# Patient Record
Sex: Male | Born: 1995 | Race: White | Hispanic: No | Marital: Single | Smoking: Current some day smoker
Health system: Southern US, Community
[De-identification: ages and names within clinical notes are randomized; demographics above are authoritative.]

## PROBLEM LIST (undated history)

## (undated) DIAGNOSIS — F909 Attention-deficit hyperactivity disorder, unspecified type: Secondary | ICD-10-CM

## (undated) HISTORY — PX: TONSILLECTOMY: SUR1361

## (undated) HISTORY — DX: Attention-deficit hyperactivity disorder, unspecified type: F90.9

---
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
## 2016-02-23 ENCOUNTER — Ambulatory Visit (INDEPENDENT_AMBULATORY_CARE_PROVIDER_SITE_OTHER): Payer: Managed Care, Other (non HMO) | Admitting: Sports Medicine

## 2016-02-23 ENCOUNTER — Encounter: Payer: Self-pay | Admitting: Sports Medicine

## 2016-02-23 VITALS — BP 106/59 | HR 59 | Resp 16

## 2016-02-23 DIAGNOSIS — M79675 Pain in left toe(s): Secondary | ICD-10-CM

## 2016-02-23 DIAGNOSIS — L03032 Cellulitis of left toe: Secondary | ICD-10-CM | POA: Diagnosis not present

## 2016-02-23 DIAGNOSIS — L03039 Cellulitis of unspecified toe: Secondary | ICD-10-CM

## 2016-02-23 NOTE — Progress Notes (Signed)
   Subjective:    Patient ID: Neil Park, male    DOB: Jan 13, 1996, 20 y.o.   MRN: [id number]  HPI    Review of Systems  HENT: Positive for sore throat.   All other systems reviewed and are negative.      Objective:   Physical Exam        Assessment & Plan:

## 2016-02-23 NOTE — Patient Instructions (Signed)

## 2016-02-23 NOTE — Progress Notes (Signed)
Subjective: Neil Park is a 20 y.o. male patient presents to office today complaining of a painful incurvated, red, hot, swollen lateral nail border of the first toe on the left foot. This has been present since last fall. Patient has treated this by two nail procedures and has been on antibiotics after having tonsils removed. Patient denies fever/chills/nausea/vomitting/any other related constitutional symptoms at this time.  Patient Active Problem List   Diagnosis Date Noted  . Dog bite of face 06/14/2014    No current outpatient prescriptions on file prior to visit.   No current facility-administered medications on file prior to visit.     Allergies  Allergen Reactions  . Clindamycin     Other reaction(s): Other (See Comments) Other  . Cefzil [Cefprozil] Rash  . Cephalexin Rash    Objective:  There were no vitals filed for this visit.  General: Well developed, nourished, in no acute distress, alert and oriented x3   Dermatology: Skin is warm, dry and supple bilateral. Left hallux nail appears to be severely incurvated with hyperkeratosis formation at the distal aspects of  the lateral nail border. (+) Erythema. (+) Edema. (-) serosanguous drainage present. The remaining nails appear unremarkable at this time. There are no open sores, lesions or other signs of infection  present.  Vascular: Dorsalis Pedis artery and Posterior Tibial artery pedal pulses are 2/4 bilateral with immedate capillary fill time. Pedal hair growth present. No lower extremity edema.   Neruologic: Grossly intact via light touch bilateral.  Musculoskeletal: Tenderness to palpation of the Left hallux lateral nail fold. Muscular strength within normal limits in all groups bilateral.   Assesement and Plan: Problem List Items Addressed This Visit    None    Visit Diagnoses    Paronychia of toe, left    -  Primary   Hallux lateral margin   Pain of toe of left foot         -Discussed treatment  alternatives and plan of care; Explained permanent/temporary nail avulsion and post procedure course to patient. Patient opt for PNA Left hallux lateral margin - After a verbal consent, injected 3 ml of a 50:50 mixture of 2% plain lidocaine and 0.5% plain marcaine in a normal hallux block fashion. Next, a  betadine prep was performed. Anesthesia was tested and found to be appropriate.  The offending left hallux lateal nail border was then incised from the hyponychium to the epinychium. The offending nail border was removed and cleared from the field. The area was curretted for any remaining nail or spicules. Phenol application performed and the area was then flushed with alcohol and dressed with antibiotic cream and a dry sterile dressing. -Patient was instructed to leave the dressing intact for today and begin soaking  in a weak solution of betadine and water tomorrow. Patient was instructed to  soak for 15 minutes each day and apply neosporin and a gauze or bandaid dressing each day. -Patient was instructed to monitor the toe for signs of infection and return to office if toe becomes red, hot or swollen. -Advised ice, elevation, and tylenol or motrin if needed for pain.  -Patient is to return as needed for follow up care or sooner if problems arise. Patient will be going away to boarding school and states has a Podiatrist in [place] that he will see if there are any problems.  Asencion Islamitorya Deavion Dobbs, DPM

## 2018-01-27 ENCOUNTER — Ambulatory Visit (HOSPITAL_COMMUNITY)
Admission: EM | Admit: 2018-01-27 | Discharge: 2018-01-27 | Disposition: A | Discharge status: Home / Self Care | Payer: BLUE CROSS/BLUE SHIELD | Attending: Family Medicine | Admitting: Family Medicine

## 2018-01-27 ENCOUNTER — Other Ambulatory Visit: Payer: Self-pay

## 2018-01-27 ENCOUNTER — Encounter (HOSPITAL_COMMUNITY): Payer: Self-pay | Admitting: Emergency Medicine

## 2018-01-27 DIAGNOSIS — H66001 Acute suppurative otitis media without spontaneous rupture of ear drum, right ear: Secondary | ICD-10-CM | POA: Diagnosis not present

## 2018-01-27 MED ORDER — FLUTICASONE PROPIONATE 50 MCG/ACT NA SUSP: 2.0000 | Freq: Every day | NASAL | 0 refills | Status: DC

## 2018-01-27 MED ORDER — AMOXICILLIN 875 MG PO TABS: 875.0000 mg | ORAL_TABLET | Freq: Two times a day (BID) | ORAL | 0 refills | Status: DC

## 2018-01-27 NOTE — ED Provider Notes (Signed)
[facility]    CSN: [id number] Arrival date & time: 01/27/18  1559     History   Chief Complaint Chief Complaint  Patient presents with  . Otalgia    right    HPI Neil Park is a 22 y.o. male.   22 year old male comes in for 4 to 5-day history of right ear pain. States has also had muffled hearing and feelings of water build up in the ear. He at first thought it was ear wax, so has been using earwax removal drops without relief.  [place] thinks [place] be caused by swimmer's ear.  He denies fever, chills, night sweats.  Denies rhinorrhea, nasal congestion, cough.  Denies regular cotton swab use.  Denies recent travels.     Past Medical History:  Diagnosis Date  . ADHD (attention deficit hyperactivity disorder)     Patient Active Problem List   Diagnosis Date Noted  . Dog bite of face 06/14/2014    Past Surgical History:  Procedure Laterality Date  . TONSILLECTOMY         Home Medications    Prior to Admission medications   Medication Sig Start Date End Date Taking? Authorizing Provider  amoxicillin (AMOXIL) 875 MG tablet Take 1 tablet (875 mg total) by mouth 2 (two) times daily for 7 days. 01/27/18 02/03/18  Cathie Hoops, Zeola Brys V, PA-C  fluticasone (FLONASE) 50 MCG/ACT nasal spray Place 2 sprays into both nostrils daily. 01/27/18   Belinda Fisher, PA-C    Family History History reviewed. No pertinent family history.  Social History Social History   Tobacco Use  . Smoking status: Never Smoker  . Smokeless tobacco: Never Used  Substance Use Topics  . Alcohol use: No  . Drug use: No     Allergies   Clindamycin; Cefzil [cefprozil]; and Cephalexin   Review of Systems Review of Systems  Reason unable to perform ROS: See HPI as above.     Physical Exam Triage Vital Signs ED Triage Vitals  Enc Vitals Group     BP 01/27/18 1628 117/75     Pulse Rate 01/27/18 1628 76     Resp 01/27/18 1628 16     Temp  01/27/18 1628 98.3 F (36.8 C)     Temp Source 01/27/18 1628 Oral     SpO2 01/27/18 1628 100 %     Weight --      Height --      Head Circumference --      Peak Flow --      Pain Score 01/27/18 1633 4     Pain Loc --      Pain Edu? --      Excl. in GC? --    No data found.  Updated Vital Signs BP 117/75 (BP Location: Left Arm)   Pulse 76   Temp 98.3 F (36.8 C) (Oral)   Resp 16   SpO2 100%   Visual Acuity Right Eye Distance:   Left Eye Distance:   Bilateral Distance:    Right Eye Near:   Left Eye Near:    Bilateral Near:     Physical Exam  Constitutional: He is oriented to person, place, and time. He appears well-developed and well-nourished. No distress.  HENT:  Head: Normocephalic and atraumatic.  Right Ear: External ear and ear canal normal. Tympanic membrane is erythematous and bulging.  Left Ear: Tympanic membrane, external ear and ear canal normal. Tympanic  membrane is not erythematous and not bulging.  Nose: Nose normal. Right sinus exhibits no maxillary sinus tenderness and no frontal sinus tenderness. Left sinus exhibits no maxillary sinus tenderness and no frontal sinus tenderness.  Mouth/Throat: Uvula is midline, oropharynx is clear and moist and mucous membranes are normal.  Mild tenderness to palpation of right tragus.  Ear canal without swelling or erythema.  Eyes: Pupils are equal, round, and reactive to light. Conjunctivae are normal.  Neck: Normal range of motion. Neck supple.  Cardiovascular: Normal rate, regular rhythm and normal heart sounds. Exam reveals no gallop and no friction rub.  No murmur heard. Pulmonary/Chest: Effort normal and breath sounds normal. He has no decreased breath sounds. He has no wheezes. He has no rhonchi. He has no rales.  Lymphadenopathy:    He has no cervical adenopathy.  Neurological: He is alert and oriented to person, place, and time.  Skin: Skin is warm and dry.  Psychiatric: He has a normal mood and affect. His  behavior is normal. Judgment normal.     UC Treatments / Results  Labs (all labs ordered are listed, but only abnormal results are displayed) Labs Reviewed - No data to display  EKG None  Radiology No results found.  Procedures Procedures (including critical care time)  Medications Ordered in UC Medications - No data to display  Initial Impression / Assessment and Plan / UC Course  I have reviewed the triage vital signs and the nursing notes.  Pertinent labs & imaging results that were available during my care of the patient were reviewed by me and considered in my medical decision making (see chart for details).    Discussed with patient, exam is more consistent with otitis media.  Start amoxicillin as directed.  Patient states has been on in the past without problems despite cephalosporin allergies.  Other symptomatic treatment discussed.  Return precautions given.  Patient expresses understanding and agrees to plan.  Final Clinical Impressions(s) / UC Diagnoses   Final diagnoses:  Non-recurrent acute suppurative otitis media of right ear without spontaneous rupture of tympanic membrane   ED Prescriptions    Medication Sig Dispense Auth. Provider   amoxicillin (AMOXIL) 875 MG tablet Take 1 tablet (875 mg total) by mouth 2 (two) times daily for 7 days. 14 tablet Brinn Westby V, PA-C   fluticasone (FLONASE) 50 MCG/ACT nasal spray Place 2 sprays into both nostrils daily. 1 g Threasa AlphaYu, Abid Bolla V, PA-C       Salvador Coupe V, [place]-C 01/27/18 1754

## 2018-01-27 NOTE — Discharge Instructions (Signed)
Amoxicillin for ear infection. Start flonase for nasal congestion/drainage. You can use over the counter nasal saline rinse such as neti pot for nasal congestion. Keep hydrated, your urine should be clear to pale yellow in color. Tylenol/motrin for fever and pain. Monitor for any worsening of symptoms, chest pain, shortness of breath, wheezing, swelling of the throat, follow up for reevaluation.

## 2018-01-27 NOTE — ED Triage Notes (Signed)
Pt complains of right ear pain and fluid build up with hearing loss for the last 4-5 days.  Pt [place]

## 2018-01-30 ENCOUNTER — Encounter (HOSPITAL_COMMUNITY): Payer: Self-pay

## 2018-01-30 ENCOUNTER — Ambulatory Visit (HOSPITAL_COMMUNITY)
Admission: EM | Admit: 2018-01-30 | Discharge: 2018-01-30 | Disposition: A | Discharge status: Home / Self Care | Payer: BLUE CROSS/BLUE SHIELD | Attending: Internal Medicine | Admitting: Internal Medicine

## 2018-01-30 DIAGNOSIS — H60391 Other infective otitis externa, right ear: Secondary | ICD-10-CM | POA: Diagnosis not present

## 2018-01-30 DIAGNOSIS — H66001 Acute suppurative otitis media without spontaneous rupture of ear drum, right ear: Secondary | ICD-10-CM

## 2018-01-30 MED ORDER — AMOXICILLIN-POT CLAVULANATE 875-125 MG PO TABS: 1.0000 | ORAL_TABLET | Freq: Two times a day (BID) | ORAL | 0 refills | Status: AC

## 2018-01-30 MED ORDER — OFLOXACIN 0.3 % OT SOLN: 10.0000 [drp] | Freq: Two times a day (BID) | OTIC | 0 refills | Status: AC

## 2018-01-30 MED ORDER — NAPROXEN 375 MG PO TABS: 375.0000 mg | ORAL_TABLET | Freq: Two times a day (BID) | ORAL | 0 refills | Status: DC

## 2018-01-30 NOTE — Discharge Instructions (Addendum)
Ofloxacin ear drops prescribed.  Place 10 drops into affected ear twice daily for 14 days Discontinue amoxicillin, begin augmentin.  Take twice daily for 10 days Prescribed naproxen.  Take twice daily as needed for pain Rest and drink plenty of fluids Take medications as directed and to completion Return here or go to the ER if you have any new or worsening symptoms, or if your symptoms do not improve in the next day or 2

## 2018-01-30 NOTE — ED Provider Notes (Signed)
[facility]   [id number] 01/30/18 Arrival Time: [id number]  SUBJECTIVE: History from: patient.  Neil Park is a 22 y.o. male who presents with right ear pain for the past 7 days.  Patient was seen on 01/27/18 and treated with amoxicillin for OM.  He denies improvement in his symptoms, and that his symptoms are gotten progressively worse within the last day.  States [place]  Patient states the pain is constant and achy in character.  Denies aggravating or alleviating factors.  Has tried OTC tylenol without relief.  Reports hx of ear infections in the past.  Complains of ear discharge.  Denies fever, chills, fatigue, sinus pain, rhinorrhea, sore throat, SOB, wheezing, chest pain, nausea, changes in bowel or bladder habits.    ROS: As per HPI.  Past Medical History:  Diagnosis Date  . ADHD (attention deficit hyperactivity disorder)    Past Surgical History:  Procedure Laterality Date  . TONSILLECTOMY     Allergies  Allergen Reactions  . Clindamycin     Other reaction(s): Other (See Comments) Other  . Cefzil [Cefprozil] Rash  . Cephalexin Rash   No current facility-administered medications on file prior to encounter.    Current Outpatient Medications on File Prior to Encounter  Medication Sig Dispense Refill  . fluticasone (FLONASE) 50 MCG/ACT nasal spray Place 2 sprays into both nostrils daily. 1 g 0   Social History   Socioeconomic History  . Marital status: Single    Spouse name: Not on file  . Number of children: Not on file  . Years of education: Not on file  . Highest education level: Not on file  Occupational History  . Not on file  Social Needs  . Financial resource strain: Not on file  . Food insecurity:    Worry: Not on file    Inability: Not on file  . Transportation needs:    Medical: Not on file    Non-medical: Not on file  Tobacco Use  . Smoking status: Never Smoker  . Smokeless tobacco: Never Used  Substance and  Sexual Activity  . Alcohol use: No  . Drug use: No  . Sexual activity: Not Currently  Lifestyle  . Physical activity:    Days per week: Not on file    Minutes per session: Not on file  . Stress: Not on file  Relationships  . Social connections:    Talks on phone: Not on file    Gets together: Not on file    Attends religious service: Not on file    Active member of club or organization: Not on file    Attends meetings of clubs or organizations: Not on file    Relationship status: Not on file  . Intimate partner violence:    Fear of current or ex partner: Not on file    Emotionally abused: Not on file    Physically abused: Not on file    Forced sexual activity: Not on file  Other Topics Concern  . Not on file  Social History Narrative  . Not on file   Family History  Problem Relation Age of Onset  . Healthy Mother   . Healthy Father     OBJECTIVE:  Vitals:   01/30/18 0822 01/30/18 0831  BP: 137/82   Pulse: 65   Resp: 16 20  Temp: 98 F (36.7 C)   TempSrc: Oral Oral  SpO2: 100%      General appearance: alert  and oriented; nontoxic appearance HEENT: Ears: LT EAC clear, TM pearly gray with visible cone of light, without erythema; RT EAC erythematous with obvious green dischage, difficult to appreciate TM due to patient discomfort, appears intact and erythematous, RT tragal and auricle discomfort with manipulation; nontender to palpation over the mastoid, mildly erythematous Eyes: PERRL, EOMI grossly; Nose: no rhinorrhea; Throat: oropharynx clear, tonsils not enlarged or erythematous Lungs: CTAB Heart: regular rate and rhythm.  Radial pulses 2+ symmetrical bilaterally Skin: warm and dry Psychological: alert and cooperative; normal mood and affect  ASSESSMENT & PLAN:  1. Other infective acute otitis externa of right ear   2. Non-recurrent acute suppurative otitis media of right ear without spontaneous rupture of tympanic membrane     Meds ordered this encounter    Medications  . amoxicillin-clavulanate (AUGMENTIN) 875-125 MG tablet    Sig: Take 1 tablet by mouth every 12 (twelve) hours for 10 days.    Dispense:  20 tablet    Refill:  0    Order Specific Question:   Supervising Provider    Answer:   Isa Rankin [phone]  . ofloxacin (FLOXIN) 0.3 % OTIC solution    Sig: Place 10 drops into the right ear 2 (two) times daily for 14 days.    Dispense:  14 mL    Refill:  0    Order Specific Question:   Supervising Provider    Answer:   Isa Rankin [[id number]]  . naproxen (NAPROSYN) 375 MG tablet    Sig: Take 1 tablet (375 mg total) by mouth 2 (two) times daily.    Dispense:  20 tablet    Refill:  0    Order Specific Question:   Supervising Provider    Answer:   Isa Rankin [[id number]]   Ofloxacin ear drops prescribed.  Place 10 drops into affected ear twice daily for 14 days Discontinue amoxicillin, begin augmentin.  Take twice daily for 10 days Prescribed naproxen.  Take twice daily as needed for pain Rest and drink plenty of fluids Take medications as directed and to completion Return here or go to the ER if you have any new or worsening symptoms, or if your symptoms do not improve in the next day or 2  Reviewed expectations re: course of current medical issues. Questions answered. Outlined signs and symptoms indicating need for more acute intervention. Patient verbalized understanding. After Visit Summary given.         Rennis Harding, PA-C 01/30/18 [phone]

## 2018-01-30 NOTE — ED Triage Notes (Signed)
Pt presents with right ear pain.

## 2021-03-07 ENCOUNTER — Other Ambulatory Visit: Payer: Self-pay

## 2021-03-07 ENCOUNTER — Ambulatory Visit
Admission: RE | Admit: 2021-03-07 | Discharge: 2021-03-07 | Disposition: A | Discharge status: Home / Self Care | Payer: BC Managed Care – PPO | Source: Ambulatory Visit | Attending: Emergency Medicine | Admitting: Emergency Medicine

## 2021-03-07 VITALS — BP 132/88 | HR 67 | Temp 98.3°F | Resp 18

## 2021-03-07 DIAGNOSIS — H6981 Other specified disorders of Eustachian tube, right ear: Secondary | ICD-10-CM

## 2021-03-07 DIAGNOSIS — H65111 Acute and subacute allergic otitis media (mucoid) (sanguinous) (serous), right ear: Secondary | ICD-10-CM

## 2021-03-07 MED ORDER — FLUTICASONE PROPIONATE 50 MCG/ACT NA SUSP: 1.0000 | Freq: Every day | NASAL | 0 refills | Status: DC

## 2021-03-07 MED ORDER — AMOXICILLIN-POT CLAVULANATE 875-125 MG PO TABS: 1.0000 | ORAL_TABLET | Freq: Two times a day (BID) | ORAL | 0 refills | Status: AC

## 2021-03-07 MED ORDER — PREDNISONE 50 MG PO TABS: 50.0000 mg | ORAL_TABLET | Freq: Every day | ORAL | 0 refills | Status: AC

## 2021-03-07 MED ORDER — CETIRIZINE HCL 10 MG PO CAPS: 10.0000 mg | ORAL_CAPSULE | Freq: Every day | ORAL | 0 refills | Status: DC

## 2021-03-07 NOTE — ED Triage Notes (Signed)
Patient presents to [facility] for evaluation of right ear pain.  Began as crackling about 1.5 weeks ago, states that has gone away but he has had intermittent sharp pains to the ear, and increasing pain on the inside of the canal.  Also c/o some dullness to sound in the left as well.

## 2021-03-07 NOTE — Discharge Instructions (Addendum)
Begin daily cetirizine, Flonase nasal spray 1 to 2 spray in each nostril daily Prednisone course with food x5 days If not seeing any improvement over the next 3 days with the above or symptoms worsening may fill prescription for Augmentin to cover for ear infection Follow-up with ENT if continuing

## 2021-03-08 NOTE — ED Provider Notes (Signed)
[facility]    CSN: [id number] Arrival date & time: 03/07/21  1304      History   Chief Complaint Chief Complaint  Patient presents with   Ear Pain    HPI Neil Park is a 25 y.o. male presenting today for evaluation of right ear pain.  Reports that over the past 1.5 weeks he has had a crackling sensation, recently has developed more intermittent sharp pains in his right ear.  Reports recent history of recurrent ear infections.  HPI  Past Medical History:  Diagnosis Date   ADHD (attention deficit hyperactivity disorder)     Patient Active Problem List   Diagnosis Date Noted   Dog bite of face 06/14/2014    Past Surgical History:  Procedure Laterality Date   TONSILLECTOMY         Home Medications    Prior to Admission medications   Medication Sig Start Date End Date Taking? Authorizing Provider  amoxicillin-clavulanate (AUGMENTIN) 875-125 MG tablet Take 1 tablet by mouth every 12 (twelve) hours for 10 days. 03/07/21 03/17/21 Yes Joaquina Nissen C, PA-C  Cetirizine HCl 10 MG CAPS Take 1 capsule (10 mg total) by mouth daily for 10 days. 03/07/21 03/17/21 Yes Orlie Cundari C, PA-C  fluticasone (FLONASE) 50 MCG/ACT nasal spray Place 1-2 sprays into both nostrils daily. 03/07/21  Yes Naydeline Morace C, PA-C  predniSONE (DELTASONE) 50 MG tablet Take 1 tablet (50 mg total) by mouth daily with breakfast for 5 days. 03/07/21 03/12/21 Yes Stephaney Steven C, PA-C  naproxen (NAPROSYN) 375 MG tablet Take 1 tablet (375 mg total) by mouth 2 (two) times daily. 01/30/18   Rennis Harding, PA-C    Family History Family History  Problem Relation Age of Onset   Healthy Mother    Healthy Father     Social History Social History   Tobacco Use   Smoking status: Some Days    Types: Cigarettes   Smokeless tobacco: Never  Substance Use Topics   Alcohol use: No   Drug use: Not Currently     Allergies   Clindamycin, Cefzil [cefprozil], and Cephalexin   Review of  Systems Review of Systems  Constitutional:  Negative for activity change, appetite change, chills, fatigue and fever.  HENT:  Positive for ear pain. Negative for congestion, rhinorrhea, sinus pressure, sore throat and trouble swallowing.   Eyes:  Negative for discharge and redness.  Respiratory:  Negative for cough, chest tightness and shortness of breath.   Cardiovascular:  Negative for chest pain.  Gastrointestinal:  Negative for abdominal pain, diarrhea, nausea and vomiting.  Musculoskeletal:  Negative for myalgias.  Skin:  Negative for rash.  Neurological:  Negative for dizziness, light-headedness and headaches.    Physical Exam Triage Vital Signs ED Triage Vitals  Enc Vitals Group     BP 03/07/21 1321 132/88     Pulse Rate 03/07/21 1321 67     Resp 03/07/21 1321 18     Temp 03/07/21 1321 98.3 F (36.8 C)     Temp Source 03/07/21 1321 Oral     SpO2 03/07/21 1321 96 %     Weight --      Height --      Head Circumference --      Peak Flow --      Pain Score 03/07/21 1322 0     Pain Loc --      Pain Edu? --      Excl. in GC? --  No data found.  Updated Vital Signs BP 132/88 (BP Location: Left Arm)   Pulse 67   Temp 98.3 F (36.8 C) (Oral)   Resp 18   SpO2 96%   Visual Acuity Right Eye Distance:   Left Eye Distance:   Bilateral Distance:    Right Eye Near:   Left Eye Near:    Bilateral Near:     Physical Exam Vitals and nursing note reviewed.  Constitutional:      Appearance: He is well-developed.     Comments: No acute distress  HENT:     Head: Normocephalic and atraumatic.     Ears:     Comments: Right TM appearing slightly opaque and irregular towards inferior portion, no erythema    Nose: Nose normal.     Mouth/Throat:     Comments: Oral mucosa pink and moist, no tonsillar enlargement or exudate. Posterior pharynx patent and nonerythematous, no uvula deviation or swelling. Normal phonation.  Eyes:     Conjunctiva/sclera: Conjunctivae normal.   Cardiovascular:     Rate and Rhythm: Normal rate and regular rhythm.  Pulmonary:     Effort: Pulmonary effort is normal. No respiratory distress.     Comments: Breathing comfortably at rest, CTABL, no wheezing, rales or other adventitious sounds auscultated  Abdominal:     General: There is no distension.  Musculoskeletal:        General: Normal range of motion.     Cervical back: Neck supple.  Skin:    General: Skin is warm and dry.  Neurological:     Mental Status: He is alert and oriented to person, place, and time.     UC Treatments / Results  Labs (all labs ordered are listed, but only abnormal results are displayed) Labs Reviewed - No data to display  EKG   Radiology No results found.  Procedures Procedures (including critical care time)  Medications Ordered in UC Medications - No data to display  Initial Impression / Assessment and Plan / UC Course  I have reviewed the triage vital signs and the nursing notes.  Pertinent labs & imaging results that were available during my care of the patient were reviewed by me and considered in my medical decision making (see chart for details).     Eustachian tube dysfunction versus early developing acute mucoid otitis media, treating with prednisone, Zyrtec and Flonase, printed prescription for Augmentin to fill if developing worsening pain and symptoms not improving with treatment for eustachian tube dysfunction.  Follow-up with ENT if continued to be persistent or recurrent.  Discussed strict return precautions. Patient verbalized understanding and is agreeable with plan.  Final Clinical Impressions(s) / UC Diagnoses   Final diagnoses:  Acute mucoid otitis media of right ear  Eustachian tube dysfunction, right     Discharge Instructions      Begin daily cetirizine, Flonase nasal spray 1 to 2 spray in each nostril daily Prednisone course with food x5 days If not seeing any improvement over the next 3 days with  the above or symptoms worsening may fill prescription for Augmentin to cover for ear infection Follow-up with ENT if continuing     ED Prescriptions     Medication Sig Dispense Auth. Provider   fluticasone (FLONASE) 50 MCG/ACT nasal spray Place 1-2 sprays into both nostrils daily. 16 g Manali Mcelmurry C, PA-C   predniSONE (DELTASONE) 50 MG tablet Take 1 tablet (50 mg total) by mouth daily with breakfast for 5 days. 5 tablet Letticia Bhattacharyya,  Roxy Mastandrea C, PA-C   Cetirizine HCl 10 MG CAPS Take 1 capsule (10 mg total) by mouth daily for 10 days. 10 capsule Sameria Morss C, PA-C   amoxicillin-clavulanate (AUGMENTIN) 875-125 MG tablet Take 1 tablet by mouth every 12 (twelve) hours for 10 days. 20 tablet Milas Schappell, [place] C, PA-C      PDMP not reviewed this encounter.   Rikki Trosper, [place] C, PA-C 03/08/21 1027

## 2021-11-15 ENCOUNTER — Ambulatory Visit (INDEPENDENT_AMBULATORY_CARE_PROVIDER_SITE_OTHER): Payer: BC Managed Care – PPO

## 2021-11-15 ENCOUNTER — Ambulatory Visit: Payer: BC Managed Care – PPO | Admitting: Podiatry

## 2021-11-15 DIAGNOSIS — M2142 Flat foot [pes planus] (acquired), left foot: Secondary | ICD-10-CM | POA: Diagnosis not present

## 2021-11-15 DIAGNOSIS — M21862 Other specified acquired deformities of left lower leg: Secondary | ICD-10-CM

## 2021-11-15 DIAGNOSIS — M21861 Other specified acquired deformities of right lower leg: Secondary | ICD-10-CM | POA: Diagnosis not present

## 2021-11-15 DIAGNOSIS — M7751 Other enthesopathy of right foot: Secondary | ICD-10-CM | POA: Diagnosis not present

## 2021-11-15 DIAGNOSIS — M7750 Other enthesopathy of unspecified foot: Secondary | ICD-10-CM

## 2021-11-15 DIAGNOSIS — M62462 Contracture of muscle, left lower leg: Secondary | ICD-10-CM

## 2021-11-15 DIAGNOSIS — M2141 Flat foot [pes planus] (acquired), right foot: Secondary | ICD-10-CM | POA: Diagnosis not present

## 2021-11-15 DIAGNOSIS — M76829 Posterior tibial tendinitis, unspecified leg: Secondary | ICD-10-CM | POA: Diagnosis not present

## 2021-11-15 DIAGNOSIS — M7752 Other enthesopathy of left foot: Secondary | ICD-10-CM

## 2021-11-15 DIAGNOSIS — M62461 Contracture of muscle, right lower leg: Secondary | ICD-10-CM

## 2021-11-15 NOTE — Patient Instructions (Signed)
Call to schedule physical therapy: ?[facility] ?[address] [place] ? [phone] ? ? ? ? ?Ask your health care provider which exercises are safe for you. Do exercises exactly as told by your health care provider and adjust them as directed. It is normal to feel mild stretching, pulling, tightness, or discomfort as you do these exercises. Stop right away if you feel sudden pain or your pain gets worse. Do not begin these exercises until told by your health care provider. ?Stretching and range-of-motion exercises ?These exercises warm up your muscles and joints and improve the movement and flexibility in your ankle and foot. These exercises may also help to relieve pain. ?Standing wall calf stretch, knee straight ? ? ?Stand with your hands against a wall. ?Extend your left / right leg behind you, and bend your front knee slightly. If directed, place a folded washcloth under the arch of your foot for support. ?Point the toes of your back foot slightly inward. ?Keeping your heels on the floor and your back knee straight, shift your weight toward the wall. Do not allow your back to arch. You should feel a gentle stretch in your upper left / right calf. ?Hold this position for 10 seconds. ?Repeat 10 times. Complete this exercise 2 times a day. ?Standing wall calf stretch, knee bent ?Stand with your hands against a wall. ?Extend your left / right leg behind you, and bend your front knee slightly. If directed, place a folded washcloth under the arch of your foot for support. ?Point the toes of your back foot slightly inward. ?Unlock your back knee so it is bent. Keep your heels on the floor. You should feel a gentle stretch deep in your lower left / right calf. ?Hold this position for 10 seconds. ?Repeat 10 times. Complete this exercise 2 times a day. ?Strengthening exercises ?These exercises build strength and endurance in your ankle and foot. Endurance is the  ability to use your muscles for a long time, even after they get tired. ?Ankle inversion with band ?Secure one end of a rubber exercise band or tubing to a fixed object, such as a table leg or a pole, that will stay still when the band is pulled. ?Loop the other end of the band around the middle of your left / right foot. ?Sit on the floor facing the object with your left / right leg extended. The band or tube should be slightly tense when your foot is relaxed. ?Leading with your big toe, slowly bring your left / right foot and ankle inward, toward your other foot (inversion). ?Hold this position for 10 seconds. ?Slowly return your foot to the starting position. ?Repeat 10 times. Complete this exercise 2 times a day. ?Towel curls ? ? ?Sit in a chair on a non-carpeted surface, and put your feet on the floor. ?Place a towel in front of your feet. ?Keeping your heel on the floor, put your left / right foot on the towel. ?Pull the towel toward you by grabbing the towel with your toes and curling them under. Keep your heel on the floor while you do this. ?Let your toes relax. ?Grab the towel with your toes again. Keep going until the towel is completely underneath your foot. ?Repeat 10 times. Complete this exercise 2 times a day. ?Balance exercise ?This exercise improves or maintains your balance. Balance is important in preventing falls. ?Single leg stand ?Without wearing shoes, stand near a railing or  in a doorway. You may hold on to the railing or door frame as needed for balance. ?Stand on your left / right foot. Keep your big toe down on the floor and try to keep your arch lifted. ?If balancing in this position is too easy, try the exercise with your eyes closed or while standing on a pillow. ?Hold this position for 10 seconds. ?Repeat 10 times. Complete this exercise 2 times a day. ?This information is not intended to replace advice given to you by your health care provider. Make sure you discuss any questions you  have with your health care provider. ? ?

## 2021-11-15 NOTE — Progress Notes (Signed)
SITUATION ?Reason for Consult: Evaluation for Bilateral Custom Foot Orthoses ?Patient / Caregiver Report: Patient is ready for foot orthotics ? ?OBJECTIVE DATA: ?Patient History / Diagnosis:  ?  ICD-10-CM   ?1. Pes planus of both feet  M21.41   ? M21.42   ?  ? ? ?Current or Previous Devices:   None and no history ? ?Foot Examination: ?Skin presentation:   Intact ?Ulcers & Callousing:   None ?Toe / Foot Deformities:  Pes planus ?Weight Bearing Presentation:  Planus ?Sensation:    Intact ? ?Shoe Size:    28M ? ?ORTHOTIC RECOMMENDATION ?Recommended Device: 1x pair of custom functional foot orthotics ? ?GOALS OF ORTHOSES ?- Reduce Pain ?- Prevent Foot Deformity ?- Prevent Progression of Further Foot Deformity ?- Relieve Pressure ?- Improve the Overall Biomechanical Function of the Foot and Lower Extremity. ? ?ACTIONS PERFORMED ?Potential out of pocket cost was communicated to patient. Patient understood and consent to casting. Patient was casted for Foot Orthoses via crush box. Procedure was explained and patient tolerated procedure well. Casts were shipped to central fabrication. All questions were answered and concerns addressed. ? ?PLAN ?Patient is to be called for fitting when devices are ready.  ? ? ?

## 2021-11-19 ENCOUNTER — Encounter: Payer: Self-pay | Admitting: Podiatry

## 2021-11-19 NOTE — Progress Notes (Signed)
?  Subjective:  ?Patient ID: Neil Park, male    DOB: 1996/04/05,  MRN: [id number] ? ?Chief Complaint  ?Patient presents with  ? Flat Foot  ?  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 Patient is complaining of pain after working long hours. Patient has a history of plantar fasciitis. Has not been fitter for orthotics.   ? ? ?26 y.o. male presents with the above complaint. History confirmed with patient.  ? ?Objective:  ?Physical Exam: ?warm, good capillary refill, no trophic changes or ulcerative lesions, normal DP and PT pulses, normal sensory exam, and bilateral he is gastrocnemius equinus with a positive Silfverskiold test.  He has pes planus deformity with collapse of the medial arch and valgus on weightbearing.  He also has mild tenderness in the arch bilateral.. ? ? ?Radiographs: ?Multiple views x-ray of both feet: Mild to moderate pes planus deformity noted ?Assessment:  ? ?1. PTTD (posterior tibial tendon dysfunction)   ?2. Pes planus of both feet   ?3. Gastrocnemius equinus of left lower extremity   ?4. Gastrocnemius equinus of right lower extremity   ?5. Tendonitis of ankle or foot   ? ? ? ?Plan:  ?Patient was evaluated and treated and all questions answered. ? ?Today we reviewed his x-rays in detail as well as my clinical findings.  We discussed shoe gear inserts and further treatment for his pes planus deformity.  I recommended physical therapy for his equinus and for strengthening the intrinsic muscles and medial stabilizers.  He is to wear nonslip shoes for work and these have not always been the most supportive.  He is interested in orthotics I do think a medial longitudinal custom molded arch support would help to give him some relief.  He will check with his insurance to see if this is something that would be covered and if he would like to be casted for this will return sooner.  I will see him back in a few months for reevaluation.  We also discussed that when these measures fail that  surgery may be helpful to correct this as well but an MRI is typically indicated for this. ? ?Return in about 3 months (around 02/15/2022) for f/u flat feet and orthotics.  ? ?

## 2021-12-18 ENCOUNTER — Ambulatory Visit: Payer: BC Managed Care – PPO

## 2021-12-18 DIAGNOSIS — M2141 Flat foot [pes planus] (acquired), right foot: Secondary | ICD-10-CM

## 2021-12-18 NOTE — Progress Notes (Signed)
SITUATION: Reason for Visit: Fitting and Delivery of Custom Fabricated Foot Orthoses Patient Report: Patient reports comfort and is satisfied with device.  OBJECTIVE DATA: Patient History / Diagnosis:     ICD-10-CM   1. Pes planus of both feet  M21.41    M21.42       Provided Device:  Custom Functional Foot Orthotics     RicheyLAB: [id number]  GOAL OF ORTHOSIS - Improve gait - Decrease energy expenditure - Improve Balance - Provide Triplanar stability of foot complex - Facilitate motion  ACTIONS PERFORMED Patient was fit with foot orthotics trimmed to shoe last. Patient tolerated fittign procedure.   Patient was provided with verbal and written instruction and demonstration regarding donning, doffing, wear, care, proper fit, function, purpose, cleaning, and use of the orthosis and in all related precautions and risks and benefits regarding the orthosis.  Patient was also provided with verbal instruction regarding how to report any failures or malfunctions of the orthosis and necessary follow up care. Patient was also instructed to contact our office regarding any change in status that may affect the function of the orthosis.  Patient demonstrated independence with proper donning, doffing, and fit and verbalized understanding of all instructions.  PLAN: Patient is to follow up in one week or as necessary (PRN). All questions were answered and concerns addressed. Plan of care was discussed with and agreed upon by the patient.

## 2022-02-19 ENCOUNTER — Ambulatory Visit: Payer: BC Managed Care – PPO | Admitting: Podiatry

## 2022-10-18 ENCOUNTER — Ambulatory Visit (HOSPITAL_COMMUNITY): Payer: Self-pay

## 2022-10-28 ENCOUNTER — Ambulatory Visit (HOSPITAL_COMMUNITY): Payer: Self-pay
